# Patient Record
Sex: Male | Born: 1965 | Race: White | Hispanic: No | Smoking: Never smoker
Health system: Southern US, Community
[De-identification: ages and names within clinical notes are randomized; demographics above are authoritative.]

## PROBLEM LIST (undated history)

## (undated) DIAGNOSIS — E119 Type 2 diabetes mellitus without complications: Secondary | ICD-10-CM

## (undated) DIAGNOSIS — E291 Testicular hypofunction: Secondary | ICD-10-CM

## (undated) DIAGNOSIS — N183 Chronic kidney disease, stage 3 unspecified: Secondary | ICD-10-CM

## (undated) DIAGNOSIS — E785 Hyperlipidemia, unspecified: Secondary | ICD-10-CM

## (undated) DIAGNOSIS — I10 Essential (primary) hypertension: Secondary | ICD-10-CM

## (undated) DIAGNOSIS — M109 Gout, unspecified: Secondary | ICD-10-CM

## (undated) DIAGNOSIS — E669 Obesity, unspecified: Secondary | ICD-10-CM

## (undated) DIAGNOSIS — F419 Anxiety disorder, unspecified: Secondary | ICD-10-CM

## (undated) DIAGNOSIS — M1990 Unspecified osteoarthritis, unspecified site: Secondary | ICD-10-CM

## (undated) HISTORY — DX: Chronic kidney disease, stage 3 (moderate): N18.3

## (undated) HISTORY — DX: Hyperlipidemia, unspecified: E78.5

## (undated) HISTORY — PX: COLONOSCOPY: SHX174

## (undated) HISTORY — DX: Unspecified osteoarthritis, unspecified site: M19.90

## (undated) HISTORY — DX: Chronic kidney disease, stage 3 unspecified: N18.30

## (undated) HISTORY — PX: POLYPECTOMY: SHX149

## (undated) HISTORY — DX: Morbid (severe) obesity due to excess calories: E66.01

## (undated) HISTORY — DX: Testicular hypofunction: E29.1

## (undated) HISTORY — DX: Essential (primary) hypertension: I10

## (undated) HISTORY — DX: Type 2 diabetes mellitus without complications: E11.9

## (undated) HISTORY — DX: Obesity, unspecified: E66.9

## (undated) HISTORY — DX: Gout, unspecified: M10.9

## (undated) HISTORY — DX: Anxiety disorder, unspecified: F41.9

---

## 1999-07-24 ENCOUNTER — Encounter: Payer: Self-pay | Admitting: Family Medicine

## 1999-07-24 ENCOUNTER — Encounter: Admission: RE | Admit: 1999-07-24 | Discharge: 1999-07-24 | Payer: Self-pay | Admitting: Family Medicine

## 1999-08-19 ENCOUNTER — Encounter: Admission: RE | Admit: 1999-08-19 | Discharge: 1999-11-17 | Payer: Self-pay | Admitting: Family Medicine

## 2001-11-15 ENCOUNTER — Ambulatory Visit (HOSPITAL_COMMUNITY): Admission: RE | Admit: 2001-11-15 | Discharge: 2001-11-15 | Payer: Self-pay | Admitting: Family Medicine

## 2001-11-15 ENCOUNTER — Encounter: Payer: Self-pay | Admitting: Family Medicine

## 2001-12-29 ENCOUNTER — Ambulatory Visit (HOSPITAL_COMMUNITY): Admission: RE | Admit: 2001-12-29 | Discharge: 2001-12-29 | Payer: Self-pay | Admitting: Family Medicine

## 2001-12-29 ENCOUNTER — Encounter: Payer: Self-pay | Admitting: Family Medicine

## 2002-02-07 ENCOUNTER — Ambulatory Visit (HOSPITAL_BASED_OUTPATIENT_CLINIC_OR_DEPARTMENT_OTHER): Admission: RE | Admit: 2002-02-07 | Discharge: 2002-02-07 | Payer: Self-pay | Admitting: Orthopedic Surgery

## 2002-07-06 ENCOUNTER — Ambulatory Visit (HOSPITAL_COMMUNITY): Admission: RE | Admit: 2002-07-06 | Discharge: 2002-07-06 | Payer: Self-pay | Admitting: Family Medicine

## 2002-07-06 ENCOUNTER — Encounter: Payer: Self-pay | Admitting: Family Medicine

## 2002-08-18 ENCOUNTER — Ambulatory Visit (HOSPITAL_COMMUNITY): Admission: RE | Admit: 2002-08-18 | Discharge: 2002-08-18 | Payer: Self-pay | Admitting: *Deleted

## 2002-08-18 ENCOUNTER — Encounter: Payer: Self-pay | Admitting: *Deleted

## 2004-12-13 ENCOUNTER — Ambulatory Visit (HOSPITAL_COMMUNITY): Admission: RE | Admit: 2004-12-13 | Discharge: 2004-12-13 | Payer: Self-pay | Admitting: Urology

## 2005-01-06 HISTORY — PX: KNEE SURGERY: SHX244

## 2005-12-22 ENCOUNTER — Ambulatory Visit (HOSPITAL_COMMUNITY): Admission: RE | Admit: 2005-12-22 | Discharge: 2005-12-22 | Payer: Self-pay | Admitting: Family Medicine

## 2005-12-23 ENCOUNTER — Ambulatory Visit (HOSPITAL_COMMUNITY): Admission: RE | Admit: 2005-12-23 | Discharge: 2005-12-23 | Payer: Self-pay | Admitting: Internal Medicine

## 2013-09-02 ENCOUNTER — Encounter: Payer: Self-pay | Admitting: Gastroenterology

## 2013-09-14 ENCOUNTER — Ambulatory Visit (INDEPENDENT_AMBULATORY_CARE_PROVIDER_SITE_OTHER): Payer: 59 | Admitting: Gastroenterology

## 2013-09-14 ENCOUNTER — Encounter: Payer: Self-pay | Admitting: Gastroenterology

## 2013-09-14 VITALS — BP 120/74 | HR 80 | Ht 66.0 in | Wt 309.0 lb

## 2013-09-14 DIAGNOSIS — R195 Other fecal abnormalities: Secondary | ICD-10-CM

## 2013-09-14 MED ORDER — PEG-KCL-NACL-NASULF-NA ASC-C 100 G PO SOLR: 1.0000 | Freq: Once | ORAL | Status: DC

## 2013-09-14 NOTE — Progress Notes (Signed)
History of Present Illness: This is a 48 year old male referred by Dr. Brigitte Pulse Hemoccult-positive stool found on a recent physical exam. He has no gastrointestinal complaints. No prior gastrointestinal evaluations. Denies weight loss, abdominal pain, constipation, diarrhea, change in stool caliber, melena, hematochezia, nausea, vomiting, dysphagia, reflux symptoms, chest pain.  No Known Allergies Outpatient Prescriptions Prior to Visit  Medication Sig Dispense Refill  . allopurinol (ZYLOPRIM) 300 MG tablet Take 300 mg by mouth daily.      Marland Kitchen amLODipine (NORVASC) 10 MG tablet Take 10 mg by mouth daily.      Marland Kitchen aspirin 81 MG tablet Take 81 mg by mouth daily.      Marland Kitchen atorvastatin (LIPITOR) 40 MG tablet Take 40 mg by mouth daily.      . Canagliflozin (INVOKANA) 300 MG TABS Take 1 tablet by mouth daily.      . colchicine 0.6 MG tablet Take 0.6 mg by mouth every 8 (eight) hours as needed.      Marland Kitchen glipiZIDE (GLUCOTROL) 5 MG tablet Take by mouth 2 (two) times daily before a meal.      . hydrochlorothiazide (MICROZIDE) 12.5 MG capsule Take 12.5 mg by mouth daily.      . meloxicam (MOBIC) 15 MG tablet Take 15 mg by mouth daily.      . metFORMIN (GLUCOPHAGE) 1000 MG tablet Take 1,000 mg by mouth 2 (two) times daily with a meal.      . valsartan (DIOVAN) 320 MG tablet Take 320 mg by mouth daily.       No facility-administered medications prior to visit.   Past Medical History  Diagnosis Date  . Gout   . Hypertension   . Hyperlipidemia   . Obesity   . Osteoarthritis   . Anxiety   . Diabetes mellitus, type 2   . Morbid obesity   . Chronic kidney disease, stage 3   . Hypogonadism in male    Past Surgical History  Procedure Laterality Date  . Knee surgery Right 2007   History   Social History  . Marital Status: Divorced    Spouse Name: N/A    Number of Children: N/A  . Years of Education: N/A   Occupational History  .  [place] History Main Topics  . Smoking status:  Never Smoker   . Smokeless tobacco: Never Used  . Alcohol Use: No  . Drug Use: No  . Sexual Activity: None   Other Topics Concern  . None   Social History Narrative  . None   Family History  Problem Relation Age of Onset  . Hypertension Mother   . Breast cancer Mother   . CVA Father   . Diabetes type II Father   . Hypertension Father      Review of Systems: Pertinent positive and negative review of systems were noted in the above HPI section. All other review of systems were otherwise negative.    Physical Exam: General: Well developed , well nourished, no acute distress Head: Normocephalic and atraumatic Eyes:  sclerae anicteric, EOMI Ears: Normal auditory acuity Mouth: No deformity or lesions Neck: Supple, no masses or thyromegaly Lungs: Clear throughout to auscultation Heart: Regular rate and rhythm; no murmurs, rubs or bruits Abdomen: Soft, non tender and non distended. No masses, hepatosplenomegaly or hernias noted. Normal Bowel sounds Rectal: Deferred to colonoscopy Musculoskeletal: Symmetrical with no gross deformities  Skin: No lesions on visible extremities Pulses:  Normal pulses noted Extremities: No clubbing,  cyanosis, edema or deformities noted Neurological: Alert oriented x 4, grossly nonfocal Cervical Nodes:  No significant cervical adenopathy Inguinal Nodes: No significant inguinal adenopathy Psychological:  Alert and cooperative. Normal mood and affect  Assessment and Recommendations:  1. Asymptomatic Hemoccult positive stool. Rule out colorectal neoplasms, hemorrhoids and other disorders. Schedule colonoscopy. The risks, benefits, and alternatives to colonoscopy with possible biopsy and possible polypectomy were discussed with the patient and they consent to proceed.

## 2013-09-14 NOTE — Patient Instructions (Signed)
You have been scheduled for a colonoscopy. Please follow written instructions given to you at your visit today.  Please pick up your prep kit at the pharmacy within the next 1-3 days. If you use inhalers (even only as needed), please bring them with you on the day of your procedure. Your physician has requested that you go to www.startemmi.com and enter the access code given to you at your visit today. This web site gives a general overview about your procedure. However, you should still follow specific instructions given to you by our office regarding your preparation for the procedure.  Thank you for choosing me and [facility].  Pricilla Riffle. Dagoberto Ligas., MD., Marval Regal  cc: Marton Redwood, MD

## 2013-09-23 ENCOUNTER — Encounter: Payer: Self-pay | Admitting: Gastroenterology

## 2013-10-20 ENCOUNTER — Encounter: Payer: Self-pay | Admitting: Gastroenterology

## 2013-10-20 ENCOUNTER — Ambulatory Visit (AMBULATORY_SURGERY_CENTER): Payer: 59 | Admitting: Gastroenterology

## 2013-10-20 VITALS — BP 117/65 | HR 76 | Temp 98.3°F | Resp 13 | Ht 66.0 in | Wt 309.0 lb

## 2013-10-20 DIAGNOSIS — R195 Other fecal abnormalities: Secondary | ICD-10-CM

## 2013-10-20 DIAGNOSIS — D124 Benign neoplasm of descending colon: Secondary | ICD-10-CM

## 2013-10-20 DIAGNOSIS — D120 Benign neoplasm of cecum: Secondary | ICD-10-CM

## 2013-10-20 DIAGNOSIS — D125 Benign neoplasm of sigmoid colon: Secondary | ICD-10-CM

## 2013-10-20 DIAGNOSIS — D122 Benign neoplasm of ascending colon: Secondary | ICD-10-CM

## 2013-10-20 LAB — GLUCOSE, CAPILLARY
GLUCOSE-CAPILLARY: 230 mg/dL — AB (ref 70–99)
Glucose-Capillary: 238 mg/dL — ABNORMAL HIGH (ref 70–99)

## 2013-10-20 MED ORDER — SODIUM CHLORIDE 0.9 % IV SOLN: 500.0000 mL | INTRAVENOUS | Status: DC

## 2013-10-20 NOTE — Op Note (Signed)
[facility]  [address] [place], [id number]   COLONOSCOPY PROCEDURE REPORT  PATIENT: Jaime Reynolds, Jaime Reynolds  MR#: [id number] BIRTHDATE: 1965/12/16 , 21  yrs. old GENDER: male ENDOSCOPIST: Ladene Artist, MD, [facility] REFERRED BY:W.  Lutricia Feil, M.D. PROCEDURE DATE:  10/20/2013 PROCEDURE:   Colonoscopy with snare polypectomy First Screening Colonoscopy - Avg.  risk and is 50 yrs.  old or older - No.  Prior Negative Screening - Now for repeat screening. N/A  History of Adenoma - Now for follow-up colonoscopy & has been > or = to 3 yrs.  N/A  Polyps Removed Today? Yes. ASA CLASS:   Class III INDICATIONS:heme-positive stool. MEDICATIONS: Monitored anesthesia care and Propofol 300 mg IV DESCRIPTION OF PROCEDURE:   After the risks benefits and alternatives of the procedure were thoroughly explained, informed consent was obtained.  The digital rectal exam revealed no abnormalities of the rectum.   The LB [id number]  endoscope was introduced through the anus and advanced to the cecum, which was identified by both the appendix and ileocecal valve. No adverse events experienced.   The quality of the prep was good, using MoviPrep  The instrument was then slowly withdrawn as the colon was fully examined.  COLON FINDINGS: Two sessile polyps measuring 6-8 mm in size were found at the cecum.  A polypectomy was performed with a cold snare. The resection was complete, the polyp tissue was completely retrieved and sent to histology.   Three sessile polyps measuring 5 mm in size were found in the sigmoid colon, descending colon, and ascending colon.  A polypectomy was performed with a cold snare. The resection was complete, the polyp tissue was completely retrieved and sent to histology.   The examination was otherwise normal.  Retroflexed views revealed no abnormalities. The time to cecum=1 minutes 36 seconds.  Withdrawal time=14 minutes 09 seconds. The scope was withdrawn and  the procedure completed.  COMPLICATIONS: There were no immediate complications.  ENDOSCOPIC IMPRESSION: 1.   Two sessile polyps at the cecum; polypectomy performed with a cold snare 2.  Three sessile polyps in the sigmoid, descendin, and ascending colon; polypectomy performed with a cold snare  RECOMMENDATIONS: 1.  Await pathology results 2.  Hold Aspirin and all other NSAIDS for 2 weeks. 3.  Repeat colonoscopy in 3 years if 3 or more polyps adenomatous: 5 years if 1-2 polyps adenomatous; otherwise 10 years  eSigned:  Ladene Artist, MD, [facility] 10/20/2013 2:03 PM

## 2013-10-20 NOTE — Progress Notes (Signed)
Patient awakening,vss,report to rn 

## 2013-10-20 NOTE — Progress Notes (Signed)
Called to room to assist during endoscopic procedure.  Patient ID and intended procedure confirmed with present staff. Received instructions for my participation in the procedure from the performing physician.  

## 2013-10-20 NOTE — Patient Instructions (Signed)

## 2013-10-21 ENCOUNTER — Telehealth: Payer: Self-pay | Admitting: *Deleted

## 2013-10-21 NOTE — Telephone Encounter (Signed)
  Follow up Call-  Call back number 10/20/2013  Post procedure Call Back phone  # [phone]  Permission to leave phone message Yes     Patient questions:  Do you have a fever, pain , or abdominal swelling? No. Pain Score  0 *  Have you tolerated food without any problems? Yes.    Have you been able to return to your normal activities? Yes.    Do you have any questions about your discharge instructions: Diet   No. Medications  No. Follow up visit  No.  Do you have questions or concerns about your Care? No.  Actions: * If pain score is 4 or above: No action needed, pain <4.

## 2013-10-26 ENCOUNTER — Encounter: Payer: Self-pay | Admitting: Gastroenterology

## 2016-01-10 DIAGNOSIS — N183 Chronic kidney disease, stage 3 (moderate): Secondary | ICD-10-CM | POA: Diagnosis not present

## 2016-01-10 DIAGNOSIS — I10 Essential (primary) hypertension: Secondary | ICD-10-CM | POA: Diagnosis not present

## 2016-01-10 DIAGNOSIS — E1129 Type 2 diabetes mellitus with other diabetic kidney complication: Secondary | ICD-10-CM | POA: Diagnosis not present

## 2016-01-25 DIAGNOSIS — E1129 Type 2 diabetes mellitus with other diabetic kidney complication: Secondary | ICD-10-CM | POA: Diagnosis not present

## 2016-01-25 DIAGNOSIS — I10 Essential (primary) hypertension: Secondary | ICD-10-CM | POA: Diagnosis not present

## 2016-01-25 DIAGNOSIS — E782 Mixed hyperlipidemia: Secondary | ICD-10-CM | POA: Diagnosis not present

## 2016-02-15 DIAGNOSIS — E119 Type 2 diabetes mellitus without complications: Secondary | ICD-10-CM | POA: Diagnosis not present

## 2016-05-30 DIAGNOSIS — N183 Chronic kidney disease, stage 3 (moderate): Secondary | ICD-10-CM | POA: Diagnosis not present

## 2016-05-30 DIAGNOSIS — I10 Essential (primary) hypertension: Secondary | ICD-10-CM | POA: Diagnosis not present

## 2016-05-30 DIAGNOSIS — E1129 Type 2 diabetes mellitus with other diabetic kidney complication: Secondary | ICD-10-CM | POA: Diagnosis not present

## 2016-06-11 ENCOUNTER — Ambulatory Visit (INDEPENDENT_AMBULATORY_CARE_PROVIDER_SITE_OTHER): Payer: 59 | Admitting: Physician Assistant

## 2016-06-11 ENCOUNTER — Encounter (INDEPENDENT_AMBULATORY_CARE_PROVIDER_SITE_OTHER): Payer: Self-pay | Admitting: Physician Assistant

## 2016-06-11 ENCOUNTER — Ambulatory Visit (INDEPENDENT_AMBULATORY_CARE_PROVIDER_SITE_OTHER): Payer: 59

## 2016-06-11 DIAGNOSIS — M25561 Pain in right knee: Secondary | ICD-10-CM

## 2016-06-11 DIAGNOSIS — G8929 Other chronic pain: Secondary | ICD-10-CM | POA: Diagnosis not present

## 2016-06-11 DIAGNOSIS — M170 Bilateral primary osteoarthritis of knee: Secondary | ICD-10-CM

## 2016-06-11 MED ORDER — DICLOFENAC SODIUM 75 MG PO TBEC: 75.0000 mg | DELAYED_RELEASE_TABLET | Freq: Two times a day (BID) | ORAL | 1 refills | Status: DC

## 2016-06-11 NOTE — Progress Notes (Signed)
Office Visit Note   Patient: Jaime Reynolds           Date of Birth: June 29, 1965           MRN: [id number] Visit Date: 06/11/2016              Requested by: Marton Redwood, MD [address] [place], [place] [address] PCP: Marton Redwood, MD   Assessment & Plan: Visit Diagnoses:  1. Chronic pain of right knee   2. Bilateral primary osteoarthritis of knee     Plan: Discussed with Jaime Reynolds treatment options he would like to continue conservative treatment of his right knee pain and osteoarthritis. Therefore recommend weight loss and I did commend him on the amount of weight that he's lost recently. Placed him on diclofenac he's to stop all other NSAIDs. Showed him open patella brace he will try to obtain one of these on his own over-the-counter. See him back in a month check his progress lack of. May consider supplemental injection for right knee in the future.  Follow-Up Instructions: Return in about 4 weeks (around 07/09/2016).   Orders:  Orders Placed This Encounter  Procedures  . XR KNEE 3 VIEW RIGHT   Meds ordered this encounter  Medications  . diclofenac (VOLTAREN) 75 MG EC tablet    Sig: Take 1 tablet (75 mg total) by mouth 2 (two) times daily.    Dispense:  60 tablet    Refill:  1      Procedures: No procedures performed   Clinical Data: No additional findings.   Subjective: Chief Complaint  Patient presents with  . Right Knee - Pain, New Patient (Initial Visit)    HPI Jaime Reynolds is a 51 year old male who were seen today for right knee pain. He's had no particular injury to the knee. He has no mechanical symptoms of the right knee. Most of his pain is along the inner side of his right knee. Has pain with prolonged standing or walking. He states he's been taking a lot of ibuprofen which seems to help some with his knee pain. He is also diabetic and has a history of gout. Reports last hemoglobin A1c was around 6. Review of Systems No chest pain shortness breath  fevers chills. Otherwise please see history of present illness  Objective: Vital Signs: There were no vitals taken for this visit.  Physical Exam  Constitutional: He is oriented to person, place, and time. He appears well-developed and well-nourished. No distress.  Pulmonary/Chest: Effort normal.  Neurological: He is alert and oriented to person, place, and time.  Psychiatric: He has a normal mood and affect. His behavior is normal.    Ortho Exam Significant crepitus both knees with passive range of motion. No instability valgus varus stressing of either knee. No effusion abnormal warmth erythema or ecchymosis of either knee. Negative McMurray's bilaterally. Right knee lacks full extension by last 5 his flexion beyond 110. Left knee full range of motion without pain. Specialty Comments:  No specialty comments available.  Imaging: Xr Knee 3 View Right  Result Date: 06/11/2016 AP lateral views sunrise view both knees: No acute fracture. Has tricompartmental changes with right knee lateral compartment moderate severe and moderate medial joint line narrowing. Left knee mild medial joint line narrowing moderate lateral joint line narrowing. Severe patellofemoral arthritic changes bilateral knees left slightly worse than right. No subluxation dislocation knees    PMFS History: There are no active problems to display for this patient.  Past Medical  History:  Diagnosis Date  . Anxiety   . Chronic kidney disease, stage 3   . Diabetes mellitus, type 2 ([place])   . Gout   . Hyperlipidemia   . Hypertension   . Hypogonadism in male   . Morbid obesity ([place])   . Obesity   . Osteoarthritis     Family History  Problem Relation Age of Onset  . Hypertension Mother   . Breast cancer Mother   . CVA Father   . Diabetes type II Father   . Hypertension Father     Past Surgical History:  Procedure Laterality Date  . KNEE SURGERY Right 2007   Social History   Occupational History  .  [place] History Main Topics  . Smoking status: Never Smoker  . Smokeless tobacco: Never Used  . Alcohol use No  . Drug use: No  . Sexual activity: Not on file

## 2016-07-17 ENCOUNTER — Ambulatory Visit (INDEPENDENT_AMBULATORY_CARE_PROVIDER_SITE_OTHER): Payer: 59 | Admitting: Physician Assistant

## 2016-07-17 DIAGNOSIS — M1711 Unilateral primary osteoarthritis, right knee: Secondary | ICD-10-CM

## 2016-07-17 NOTE — Progress Notes (Signed)
Jaime Reynolds returns today follow-up of his right knee osteoarthritis. He states that the exercises the open patella brace in the diclofenac definitely helped with his pain in the knee. He states overall is doing well. He's having no pain in the knee. Still some stiffness. No mechanical symptoms of the knee   Physical exam: General well-developed well-nourished male in no acute distress. Mood and Affect appropriate. Psychiatric alert and 3 Right knee no effusion abnormal warmth or erythema. He has crepitus with passive range of motion of knee. No instability valgus varus stressing.  Impression: Osteoarthritis arthritis right knee  Plan: Jaime Reynolds is overall doing well very conservative measures a flight to continue these conservative measures now. It did discuss with him the need to continue to work on quad strengthening and weight loss. May consider cortisone injection in the. Recommended trying Tumeric.

## 2016-08-06 ENCOUNTER — Other Ambulatory Visit (INDEPENDENT_AMBULATORY_CARE_PROVIDER_SITE_OTHER): Payer: Self-pay | Admitting: Physician Assistant

## 2016-09-05 ENCOUNTER — Other Ambulatory Visit (INDEPENDENT_AMBULATORY_CARE_PROVIDER_SITE_OTHER): Payer: Self-pay | Admitting: Orthopaedic Surgery

## 2016-09-30 DIAGNOSIS — E1129 Type 2 diabetes mellitus with other diabetic kidney complication: Secondary | ICD-10-CM | POA: Diagnosis not present

## 2016-09-30 DIAGNOSIS — E782 Mixed hyperlipidemia: Secondary | ICD-10-CM | POA: Diagnosis not present

## 2016-09-30 DIAGNOSIS — M109 Gout, unspecified: Secondary | ICD-10-CM | POA: Diagnosis not present

## 2016-09-30 DIAGNOSIS — Z125 Encounter for screening for malignant neoplasm of prostate: Secondary | ICD-10-CM | POA: Diagnosis not present

## 2016-10-04 ENCOUNTER — Other Ambulatory Visit (INDEPENDENT_AMBULATORY_CARE_PROVIDER_SITE_OTHER): Payer: Self-pay | Admitting: Orthopaedic Surgery

## 2016-10-07 DIAGNOSIS — E1129 Type 2 diabetes mellitus with other diabetic kidney complication: Secondary | ICD-10-CM | POA: Diagnosis not present

## 2016-10-07 DIAGNOSIS — I10 Essential (primary) hypertension: Secondary | ICD-10-CM | POA: Diagnosis not present

## 2016-10-07 DIAGNOSIS — Z0000 Encounter for general adult medical examination without abnormal findings: Secondary | ICD-10-CM | POA: Diagnosis not present

## 2016-10-07 DIAGNOSIS — E782 Mixed hyperlipidemia: Secondary | ICD-10-CM | POA: Diagnosis not present

## 2016-10-09 ENCOUNTER — Encounter: Payer: Self-pay | Admitting: Gastroenterology

## 2016-10-17 DIAGNOSIS — Z1212 Encounter for screening for malignant neoplasm of rectum: Secondary | ICD-10-CM | POA: Diagnosis not present

## 2016-11-05 ENCOUNTER — Other Ambulatory Visit (INDEPENDENT_AMBULATORY_CARE_PROVIDER_SITE_OTHER): Payer: Self-pay | Admitting: Orthopaedic Surgery

## 2016-12-06 ENCOUNTER — Other Ambulatory Visit (INDEPENDENT_AMBULATORY_CARE_PROVIDER_SITE_OTHER): Payer: Self-pay | Admitting: Orthopaedic Surgery

## 2016-12-12 ENCOUNTER — Ambulatory Visit (AMBULATORY_SURGERY_CENTER): Payer: Self-pay | Admitting: *Deleted

## 2016-12-12 ENCOUNTER — Other Ambulatory Visit: Payer: Self-pay

## 2016-12-12 VITALS — Ht 67.0 in | Wt 294.0 lb

## 2016-12-12 DIAGNOSIS — Z86010 Personal history of colonic polyps: Secondary | ICD-10-CM

## 2016-12-12 MED ORDER — NA SULFATE-K SULFATE-MG SULF 17.5-3.13-1.6 GM/177ML PO SOLN: 1.0000 [IU] | Freq: Once | ORAL | 0 refills | Status: AC

## 2016-12-12 NOTE — Progress Notes (Signed)
No egg or soy allergy known to patient  No issues with past sedation with any surgeries  or procedures, no intubation problems  No diet pills per patient No home 02 use per patient  No blood thinners per patient  Pt denies issues with constipation  No A fib or A flutter  EMMI video sent to pt's e mail pt declined   

## 2016-12-15 ENCOUNTER — Encounter: Payer: Self-pay | Admitting: Gastroenterology

## 2016-12-26 ENCOUNTER — Encounter: Payer: Self-pay | Admitting: Gastroenterology

## 2016-12-26 ENCOUNTER — Other Ambulatory Visit: Payer: Self-pay

## 2016-12-26 ENCOUNTER — Ambulatory Visit (AMBULATORY_SURGERY_CENTER): Payer: 59 | Admitting: Gastroenterology

## 2016-12-26 VITALS — BP 128/68 | HR 76 | Temp 99.1°F | Resp 21 | Ht 67.0 in | Wt 294.0 lb

## 2016-12-26 DIAGNOSIS — Z86010 Personal history of colonic polyps: Secondary | ICD-10-CM

## 2016-12-26 DIAGNOSIS — E119 Type 2 diabetes mellitus without complications: Secondary | ICD-10-CM | POA: Diagnosis not present

## 2016-12-26 DIAGNOSIS — I10 Essential (primary) hypertension: Secondary | ICD-10-CM | POA: Diagnosis not present

## 2016-12-26 MED ORDER — SODIUM CHLORIDE 0.9 % IV SOLN: 500.0000 mL | Freq: Once | INTRAVENOUS | Status: AC

## 2016-12-26 NOTE — Progress Notes (Signed)
To recovery, report to RN, VSS. 

## 2016-12-26 NOTE — Patient Instructions (Signed)
YOU HAD AN ENDOSCOPIC PROCEDURE TODAY AT THE [facility]:   Refer to the procedure report that was given to you for any specific questions about what was found during the examination.  If the procedure report does not answer your questions, please call your gastroenterologist to clarify.  If you requested that your care partner not be given the details of your procedure findings, then the procedure report has been included in a sealed envelope for you to review at your convenience later.  YOU SHOULD EXPECT: Some feelings of bloating in the abdomen. Passage of more gas than usual.  Walking can help get rid of the air that was put into your GI tract during the procedure and reduce the bloating. If you had a lower endoscopy (such as a colonoscopy or flexible sigmoidoscopy) you may notice spotting of blood in your stool or on the toilet paper. If you underwent a bowel prep for your procedure, you may not have a normal bowel movement for a few days.  Please Note:  You might notice some irritation and congestion in your nose or some drainage.  This is from the oxygen used during your procedure.  There is no need for concern and it should clear up in a day or so.  SYMPTOMS TO REPORT IMMEDIATELY:   Following lower endoscopy (colonoscopy or flexible sigmoidoscopy):  Excessive amounts of blood in the stool  Significant tenderness or worsening of abdominal pains  Swelling of the abdomen that is new, acute  Fever of 100F or higher   For urgent or emergent issues, a gastroenterologist can be reached at any hour by calling [phone].   DIET:  We do recommend a small meal at first, but then you may proceed to your regular diet.  Drink plenty of fluids but you should avoid alcoholic beverages for 24 hours.  ACTIVITY:  You should plan to take it easy for the rest of today and you should NOT DRIVE or use heavy machinery until tomorrow (because of the sedation medicines used during the test).     FOLLOW UP: Our staff will call the number listed on your records the next business day following your procedure to check on you and address any questions or concerns that you may have regarding the information given to you following your procedure. If we do not reach you, we will leave a message.  However, if you are feeling well and you are not experiencing any problems, there is no need to return our call.  We will assume that you have returned to your regular daily activities without incident.  If any biopsies were taken you will be contacted by phone or by letter within the next 1-3 weeks.  Please call us at [phone] if you have not heard about the biopsies in 3 weeks.    SIGNATURES/CONFIDENTIALITY: You and/or your care partner have signed paperwork which will be entered into your electronic medical record.  These signatures attest to the fact that that the information above on your After Visit Summary has been reviewed and is understood.  Full responsibility of the confidentiality of this discharge information lies with you and/or your care-partner.  Read all of the handouts given to you by your recovery room nurse. 

## 2016-12-26 NOTE — Progress Notes (Signed)
Pt's states no medical or surgical changes since previsit or office visit. 

## 2016-12-26 NOTE — Op Note (Signed)
[facility] Patient Name: Jaime Reynolds Procedure Date: 12/26/2016 9:12 AM MRN: [id number] Endoscopist: Ladene Artist , MD Age: 51 Referring MD:  Date of Birth: 11/25/1965 Gender: Male Account #: [id number] Procedure:                Colonoscopy Indications:              Surveillance: Personal history of adenomatous                            polyps on last colonoscopy 3 years ago Medicines:                Monitored Anesthesia Care Procedure:                Pre-Anesthesia Assessment:                           - Prior to the procedure, a History and Physical                            was performed, and patient medications and                            allergies were reviewed. The patient's tolerance of                            previous anesthesia was also reviewed. The risks                            and benefits of the procedure and the sedation                            options and risks were discussed with the patient.                            All questions were answered, and informed consent                            was obtained. Prior Anticoagulants: The patient has                            taken no previous anticoagulant or antiplatelet                            agents. ASA Grade Assessment: II - A patient with                            mild systemic disease. After reviewing the risks                            and benefits, the patient was deemed in                            satisfactory condition to undergo the procedure.  After obtaining informed consent, the colonoscope                            was passed under direct vision. Throughout the                            procedure, the patient's blood pressure, pulse, and                            oxygen saturations were monitored continuously. The                            Model CF-HQ190L [id number]) scope was introduced                            through the anus and  advanced to the the cecum,                            identified by appendiceal orifice and ileocecal                            valve. The ileocecal valve, appendiceal orifice,                            and rectum were photographed. The quality of the                            bowel preparation was good. The colonoscopy was                            performed without difficulty. The patient tolerated                            the procedure well. Scope In: 9:15:15 AM Scope Out: 9:26:02 AM Scope Withdrawal Time: 0 hours 9 minutes 20 seconds  Total Procedure Duration: 0 hours 10 minutes 47 seconds  Findings:                 The perianal and digital rectal examinations were                            normal.                           Internal hemorrhoids were found during                            retroflexion. The hemorrhoids were small and Grade                            I (internal hemorrhoids that do not prolapse).                           The exam was otherwise without abnormality on  direct and retroflexion views. Complications:            No immediate complications. Estimated blood loss:                            None. Estimated Blood Loss:     Estimated blood loss: none. Impression:               - Internal hemorrhoids.                           - The examination was otherwise normal on direct                            and retroflexion views.                           - No specimens collected. Recommendation:           - Repeat colonoscopy in 5 years for surveillance.                           - Patient has a contact number available for                            emergencies. The signs and symptoms of potential                            delayed complications were discussed with the                            patient. Return to normal activities tomorrow.                            Written discharge instructions were provided to the                             patient.                           - Resume previous diet.                           - Continue present medications. Ladene Artist, MD 12/26/2016 9:29:27 AM This report has been signed electronically.

## 2016-12-29 ENCOUNTER — Telehealth: Payer: Self-pay | Admitting: *Deleted

## 2016-12-29 NOTE — Telephone Encounter (Signed)
  Follow up Call-  Call back number 12/26/2016  Post procedure Call Back phone  # [phone]  Permission to leave phone message Yes  Some recent data might be hidden     Patient questions:  Do you have a fever, pain , or abdominal swelling? No. Pain Score  0 *  Have you tolerated food without any problems? Yes.    Have you been able to return to your normal activities? Yes.    Do you have any questions about your discharge instructions: Diet   No. Medications  No. Follow up visit  No.  Do you have questions or concerns about your Care? No.  Actions: * If pain score is 4 or above: No action needed, pain <4.

## 2017-01-09 ENCOUNTER — Other Ambulatory Visit (INDEPENDENT_AMBULATORY_CARE_PROVIDER_SITE_OTHER): Payer: Self-pay | Admitting: Orthopaedic Surgery

## 2017-02-06 ENCOUNTER — Other Ambulatory Visit (INDEPENDENT_AMBULATORY_CARE_PROVIDER_SITE_OTHER): Payer: Self-pay | Admitting: Orthopaedic Surgery

## 2017-02-26 DIAGNOSIS — E119 Type 2 diabetes mellitus without complications: Secondary | ICD-10-CM | POA: Diagnosis not present

## 2017-03-06 ENCOUNTER — Other Ambulatory Visit (INDEPENDENT_AMBULATORY_CARE_PROVIDER_SITE_OTHER): Payer: Self-pay | Admitting: Orthopaedic Surgery

## 2017-04-01 DIAGNOSIS — R208 Other disturbances of skin sensation: Secondary | ICD-10-CM | POA: Diagnosis not present

## 2017-04-03 ENCOUNTER — Other Ambulatory Visit (INDEPENDENT_AMBULATORY_CARE_PROVIDER_SITE_OTHER): Payer: Self-pay | Admitting: Orthopaedic Surgery

## 2017-04-10 DIAGNOSIS — E782 Mixed hyperlipidemia: Secondary | ICD-10-CM | POA: Diagnosis not present

## 2017-04-10 DIAGNOSIS — I10 Essential (primary) hypertension: Secondary | ICD-10-CM | POA: Diagnosis not present

## 2017-04-10 DIAGNOSIS — E1129 Type 2 diabetes mellitus with other diabetic kidney complication: Secondary | ICD-10-CM | POA: Diagnosis not present

## 2017-05-02 ENCOUNTER — Other Ambulatory Visit (INDEPENDENT_AMBULATORY_CARE_PROVIDER_SITE_OTHER): Payer: Self-pay | Admitting: Orthopaedic Surgery

## 2017-06-06 ENCOUNTER — Other Ambulatory Visit (INDEPENDENT_AMBULATORY_CARE_PROVIDER_SITE_OTHER): Payer: Self-pay | Admitting: Orthopaedic Surgery

## 2017-07-17 ENCOUNTER — Other Ambulatory Visit (INDEPENDENT_AMBULATORY_CARE_PROVIDER_SITE_OTHER): Payer: Self-pay | Admitting: Orthopaedic Surgery

## 2017-08-16 ENCOUNTER — Other Ambulatory Visit (INDEPENDENT_AMBULATORY_CARE_PROVIDER_SITE_OTHER): Payer: Self-pay | Admitting: Orthopaedic Surgery

## 2017-09-13 ENCOUNTER — Other Ambulatory Visit (INDEPENDENT_AMBULATORY_CARE_PROVIDER_SITE_OTHER): Payer: Self-pay | Admitting: Physician Assistant

## 2017-09-14 NOTE — Telephone Encounter (Signed)
Ok for refill? 

## 2017-10-18 ENCOUNTER — Other Ambulatory Visit (INDEPENDENT_AMBULATORY_CARE_PROVIDER_SITE_OTHER): Payer: Self-pay | Admitting: Physician Assistant

## 2017-11-13 DIAGNOSIS — Z0000 Encounter for general adult medical examination without abnormal findings: Secondary | ICD-10-CM | POA: Diagnosis not present

## 2017-11-13 DIAGNOSIS — E782 Mixed hyperlipidemia: Secondary | ICD-10-CM | POA: Diagnosis not present

## 2017-11-13 DIAGNOSIS — E1129 Type 2 diabetes mellitus with other diabetic kidney complication: Secondary | ICD-10-CM | POA: Diagnosis not present

## 2017-11-13 DIAGNOSIS — R82998 Other abnormal findings in urine: Secondary | ICD-10-CM | POA: Diagnosis not present

## 2017-11-13 DIAGNOSIS — Z125 Encounter for screening for malignant neoplasm of prostate: Secondary | ICD-10-CM | POA: Diagnosis not present

## 2017-11-17 DIAGNOSIS — E782 Mixed hyperlipidemia: Secondary | ICD-10-CM | POA: Diagnosis not present

## 2017-11-17 DIAGNOSIS — E1129 Type 2 diabetes mellitus with other diabetic kidney complication: Secondary | ICD-10-CM | POA: Diagnosis not present

## 2017-11-17 DIAGNOSIS — I10 Essential (primary) hypertension: Secondary | ICD-10-CM | POA: Diagnosis not present

## 2017-11-17 DIAGNOSIS — Z1389 Encounter for screening for other disorder: Secondary | ICD-10-CM | POA: Diagnosis not present

## 2017-11-17 DIAGNOSIS — Z0000 Encounter for general adult medical examination without abnormal findings: Secondary | ICD-10-CM | POA: Diagnosis not present

## 2017-11-20 DIAGNOSIS — Z1212 Encounter for screening for malignant neoplasm of rectum: Secondary | ICD-10-CM | POA: Diagnosis not present

## 2017-11-22 ENCOUNTER — Other Ambulatory Visit (INDEPENDENT_AMBULATORY_CARE_PROVIDER_SITE_OTHER): Payer: Self-pay | Admitting: Orthopaedic Surgery

## 2017-12-20 ENCOUNTER — Other Ambulatory Visit (INDEPENDENT_AMBULATORY_CARE_PROVIDER_SITE_OTHER): Payer: Self-pay | Admitting: Orthopaedic Surgery

## 2017-12-21 NOTE — Telephone Encounter (Signed)
Ok to refill 

## 2017-12-21 NOTE — Telephone Encounter (Signed)
LMOM notifying patient.

## 2018-01-17 ENCOUNTER — Other Ambulatory Visit (INDEPENDENT_AMBULATORY_CARE_PROVIDER_SITE_OTHER): Payer: Self-pay | Admitting: Orthopaedic Surgery

## 2018-01-22 ENCOUNTER — Ambulatory Visit: Payer: 59 | Admitting: Podiatry

## 2018-02-12 ENCOUNTER — Other Ambulatory Visit (INDEPENDENT_AMBULATORY_CARE_PROVIDER_SITE_OTHER): Payer: Self-pay | Admitting: Orthopaedic Surgery

## 2018-02-12 NOTE — Telephone Encounter (Signed)
Ok for refill? 

## 2018-03-31 ENCOUNTER — Other Ambulatory Visit (INDEPENDENT_AMBULATORY_CARE_PROVIDER_SITE_OTHER): Payer: Self-pay | Admitting: Orthopaedic Surgery

## 2018-03-31 ENCOUNTER — Telehealth (INDEPENDENT_AMBULATORY_CARE_PROVIDER_SITE_OTHER): Payer: Self-pay | Admitting: Orthopaedic Surgery

## 2018-03-31 MED ORDER — DICLOFENAC SODIUM 75 MG PO TBEC: 75.0000 mg | DELAYED_RELEASE_TABLET | Freq: Two times a day (BID) | ORAL | 3 refills | Status: AC | PRN

## 2018-03-31 NOTE — Telephone Encounter (Signed)
See below

## 2018-03-31 NOTE — Telephone Encounter (Signed)
New Message   *STAT* If patient is at the pharmacy, call can be transferred to refill team.   1. Which medications need to be refilled? (please list name of each medication and dose if known)  diclofenac 75 mg tablet once daily  2. Which pharmacy/location (including street and city if local pharmacy) is medication to be sent to? [facility] [address] [place], [place] [address]  3. Do they need a 30 day or 90 day supply?  30 day supply

## 2018-05-21 DIAGNOSIS — I10 Essential (primary) hypertension: Secondary | ICD-10-CM | POA: Diagnosis not present

## 2018-05-21 DIAGNOSIS — E782 Mixed hyperlipidemia: Secondary | ICD-10-CM | POA: Diagnosis not present

## 2018-05-21 DIAGNOSIS — E1129 Type 2 diabetes mellitus with other diabetic kidney complication: Secondary | ICD-10-CM | POA: Diagnosis not present

## 2018-05-24 DIAGNOSIS — E1129 Type 2 diabetes mellitus with other diabetic kidney complication: Secondary | ICD-10-CM | POA: Diagnosis not present

## 2019-03-31 ENCOUNTER — Emergency Department (HOSPITAL_COMMUNITY)
Admission: EM | Admit: 2019-03-31 | Discharge: 2019-03-31 | Disposition: A | Discharge status: Home / Self Care | Payer: No Typology Code available for payment source | Attending: Emergency Medicine | Admitting: Emergency Medicine

## 2019-03-31 ENCOUNTER — Encounter (HOSPITAL_COMMUNITY): Payer: Self-pay | Admitting: Emergency Medicine

## 2019-03-31 ENCOUNTER — Emergency Department (HOSPITAL_COMMUNITY): Payer: No Typology Code available for payment source

## 2019-03-31 DIAGNOSIS — S0083XA Contusion of other part of head, initial encounter: Secondary | ICD-10-CM | POA: Insufficient documentation

## 2019-03-31 DIAGNOSIS — E1122 Type 2 diabetes mellitus with diabetic chronic kidney disease: Secondary | ICD-10-CM | POA: Diagnosis not present

## 2019-03-31 DIAGNOSIS — S0990XA Unspecified injury of head, initial encounter: Secondary | ICD-10-CM | POA: Diagnosis present

## 2019-03-31 DIAGNOSIS — Y999 Unspecified external cause status: Secondary | ICD-10-CM | POA: Insufficient documentation

## 2019-03-31 DIAGNOSIS — Z79899 Other long term (current) drug therapy: Secondary | ICD-10-CM | POA: Diagnosis not present

## 2019-03-31 DIAGNOSIS — S0081XA Abrasion of other part of head, initial encounter: Secondary | ICD-10-CM | POA: Diagnosis not present

## 2019-03-31 DIAGNOSIS — N183 Chronic kidney disease, stage 3 unspecified: Secondary | ICD-10-CM | POA: Diagnosis not present

## 2019-03-31 DIAGNOSIS — Y92410 Unspecified street and highway as the place of occurrence of the external cause: Secondary | ICD-10-CM | POA: Diagnosis not present

## 2019-03-31 DIAGNOSIS — Y939 Activity, unspecified: Secondary | ICD-10-CM | POA: Diagnosis not present

## 2019-03-31 DIAGNOSIS — Z794 Long term (current) use of insulin: Secondary | ICD-10-CM | POA: Diagnosis not present

## 2019-03-31 DIAGNOSIS — I129 Hypertensive chronic kidney disease with stage 1 through stage 4 chronic kidney disease, or unspecified chronic kidney disease: Secondary | ICD-10-CM | POA: Insufficient documentation

## 2019-03-31 DIAGNOSIS — T148XXA Other injury of unspecified body region, initial encounter: Secondary | ICD-10-CM

## 2019-03-31 DIAGNOSIS — Z23 Encounter for immunization: Secondary | ICD-10-CM | POA: Insufficient documentation

## 2019-03-31 MED ORDER — BACITRACIN ZINC 500 UNIT/GM EX OINT
TOPICAL_OINTMENT | Freq: Two times a day (BID) | CUTANEOUS | Status: DC
  Administered 2019-03-31: 1 via TOPICAL
  Filled 2019-03-31: qty 0.9

## 2019-03-31 MED ORDER — METHOCARBAMOL 500 MG PO TABS: 500.0000 mg | ORAL_TABLET | Freq: Two times a day (BID) | ORAL | 0 refills | Status: AC

## 2019-03-31 MED ORDER — TETANUS-DIPHTH-ACELL PERTUSSIS 5-2.5-18.5 LF-MCG/0.5 IM SUSP
0.5000 mL | Freq: Once | INTRAMUSCULAR | Status: AC
  Administered 2019-03-31: 0.5 mL via INTRAMUSCULAR
  Filled 2019-03-31: qty 0.5

## 2019-03-31 NOTE — Discharge Instructions (Signed)
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
## 2021-12-04 ENCOUNTER — Encounter: Payer: Self-pay | Admitting: Gastroenterology

## 2022-01-24 ENCOUNTER — Ambulatory Visit: Payer: 59 | Admitting: Diagnostic Neuroimaging

## 2022-03-13 ENCOUNTER — Encounter: Payer: Self-pay | Admitting: Diagnostic Neuroimaging

## 2022-03-13 ENCOUNTER — Ambulatory Visit: Payer: 59 | Admitting: Diagnostic Neuroimaging

## 2022-03-13 VITALS — BP 146/83 | HR 96 | Ht 66.0 in | Wt 273.4 lb

## 2022-03-13 DIAGNOSIS — H539 Unspecified visual disturbance: Secondary | ICD-10-CM

## 2022-03-13 NOTE — Progress Notes (Signed)
[facility]  PATIENT: Jaime Reynolds DOB: 1965/10/06  REFERRING CLINICIAN: [facility]* HISTORY FROM: patient  REASON FOR VISIT: new consult    HISTORICAL  CHIEF COMPLAINT:  Chief Complaint  Patient presents with   New Patient (Initial Visit)    Patient in room #6 and alone. Pt here today to discuss his vision. Pt states it seems his eyes goes white and he can't see anything for a few seconds.    HISTORY OF PRESENT ILLNESS:   57 year old male here for evaluation of transient visual disturbance.  History of hypertension, hyperlipidemia, diabetes, chronic kidney disease.  Patient had cataract surgeries in 2023, left side in February, right-sided March without any issues.  1 to 2 weeks after his second surgery he noticed a 5-second transient visual disturbance where his vision went totally white on both eyes.  No other associated symptoms.  He had a second event a week later.  Since that time no further events.  Patient denies any headaches, numbness, weakness, double vision or loss of vision.    REVIEW OF SYSTEMS: Full 14 system review of systems performed and negative with exception of: as per HPI.  ALLERGIES: No Known Allergies  HOME MEDICATIONS: Outpatient Medications Prior to Visit  Medication Sig Dispense Refill   allopurinol (ZYLOPRIM) 300 MG tablet Take 300 mg by mouth daily.     amLODipine (NORVASC) 10 MG tablet Take 10 mg by mouth daily.     atorvastatin (LIPITOR) 40 MG tablet Take 40 mg by mouth daily.     BD PEN NEEDLE NANO U/F 32G X 4 MM MISC   0   Canagliflozin (INVOKANA) 300 MG TABS Take 1 tablet by mouth daily.     colchicine 0.6 MG tablet Take 0.6 mg by mouth every 8 (eight) hours as needed.     hydrochlorothiazide (MICROZIDE) 12.5 MG capsule Take 12.5 mg by mouth daily.     irbesartan (AVAPRO) 300 MG tablet Take 300 mg by mouth daily.     JARDIANCE 25 MG TABS tablet Take 25 mg by mouth daily.     metFORMIN (GLUCOPHAGE) 1000  MG tablet Take 1,000 mg by mouth 2 (two) times daily with a meal.     methocarbamol (ROBAXIN) 500 MG tablet Take 1 tablet (500 mg total) by mouth 2 (two) times daily. 20 tablet 0   metoprolol succinate (TOPROL-XL) 50 MG 24 hr tablet Take 50 mg by mouth daily.     ONE TOUCH ULTRA TEST test strip SELF MONITOR BLOOD GLUCOSE TWICE A DAY  1   rosuvastatin (CRESTOR) 10 MG tablet Take 10 mg by mouth daily.     diclofenac (VOLTAREN) 75 MG EC tablet Take 1 tablet (75 mg total) by mouth 2 (two) times daily between meals as needed. (Patient not taking: Reported on 03/13/2022) 60 tablet 3   LEVEMIR FLEXTOUCH 100 UNIT/ML Pen INJECT 40 UNITS ONCE DAILY (Patient not taking: Reported on [id number])  0   TRULICITY 1.5 0000000 SOPN  (Patient not taking: Reported on 03/13/2022)  1   Facility-Administered Medications Prior to Visit  Medication Dose Route Frequency Provider Last Rate Last Admin   0.9 %  sodium chloride infusion  500 mL Intravenous Once Ladene Artist, MD        PAST MEDICAL HISTORY: Past Medical History:  Diagnosis Date   Anxiety    Chronic kidney disease, stage 3 ([place])    pt. denies so not sure   Diabetes mellitus, type 2 ([place])  Gout    Hyperlipidemia    Hypertension    Hypogonadism in male    Morbid obesity ([place])    Obesity    Osteoarthritis     PAST SURGICAL HISTORY: Past Surgical History:  Procedure Laterality Date   COLONOSCOPY     KNEE SURGERY Right 2007   POLYPECTOMY      FAMILY HISTORY: Family History  Problem Relation Age of Onset   Hypertension Mother    Breast cancer Mother    CVA Father    Diabetes type II Father    Hypertension Father    Colon cancer Neg Hx    Colon polyps Neg Hx    Esophageal cancer Neg Hx    Rectal cancer Neg Hx    Stomach cancer Neg Hx     SOCIAL HISTORY: Social History   Socioeconomic History   Marital status: Divorced    Spouse name: Not on file   Number of children: Not on file   Years of education: Not on file   Highest  education level: Not on file  Occupational History    Employer: [employer]  Tobacco Use   Smoking status: Never   Smokeless tobacco: Never  Substance and Sexual Activity   Alcohol use: No   Drug use: No   Sexual activity: Not on file  Other Topics Concern   Not on file  Social History Narrative   Not on file   Social Determinants of Health   Financial Resource Strain: Not on file  Food Insecurity: Not on file  Transportation Needs: Not on file  Physical Activity: Not on file  Stress: Not on file  Social Connections: Not on file  Intimate Partner Violence: Not on file     PHYSICAL EXAM  GENERAL EXAM/CONSTITUTIONAL: Vitals:  Vitals:   03/13/22 1545  BP: (!) 146/83  Pulse: 96  Weight: 273 lb 6.4 oz (124 kg)  Height: '5\' 6"'$  (1.676 m)   Body mass index is 44.13 kg/m. Wt Readings from Last 3 Encounters:  03/13/22 273 lb 6.4 oz (124 kg)  12/26/16 294 lb (133.4 kg)  12/12/16 294 lb (133.4 kg)   Patient is in no distress; well developed, nourished and groomed; neck is supple  CARDIOVASCULAR: Examination of carotid arteries is normal; no carotid bruits Regular rate and rhythm, no murmurs Examination of peripheral vascular system by observation and palpation is normal  EYES: Ophthalmoscopic exam of optic discs and posterior segments is normal; no papilledema or hemorrhages No results found.  MUSCULOSKELETAL: Gait, strength, tone, movements noted in Neurologic exam below  NEUROLOGIC: MENTAL STATUS:      No data to display         awake, alert, oriented to person, place and time recent and remote memory intact normal attention and concentration language fluent, comprehension intact, naming intact fund of knowledge appropriate  CRANIAL NERVE:  2nd - no papilledema on fundoscopic exam 2nd, 3rd, 4th, 6th - pupils equal and reactive to light, visual fields full to confrontation, extraocular muscles intact, no nystagmus 5th - facial sensation  symmetric 7th - facial strength symmetric 8th - hearing intact 9th - palate elevates symmetrically, uvula midline 11th - shoulder shrug symmetric 12th - tongue protrusion midline  MOTOR:  normal bulk and tone, full strength in the BUE, BLE  SENSORY:  normal and symmetric to light touch, temperature, vibration  COORDINATION:  finger-nose-finger, fine finger movements normal  REFLEXES:  deep tendon reflexes TRACE and symmetric  GAIT/STATION:  narrow based gait  DIAGNOSTIC DATA (LABS, IMAGING, TESTING) - I reviewed patient records, labs, notes, testing and imaging myself where available.  No results found for: "WBC", "HGB", "HCT", "MCV", "PLT" No results found for: "NA", "K", "CL", "CO2", "GLUCOSE", "BUN", "CREATININE", "CALCIUM", "PROT", "ALBUMIN", "AST", "ALT", "ALKPHOS", "BILITOT", "GFRNONAA", "GFRAA" No results found for: "CHOL", "HDL", "LDLCALC", "LDLDIRECT", "TRIG", "CHOLHDL" No results found for: "HGBA1C" No results found for: "VITAMINB12" No results found for: "TSH"     ASSESSMENT AND PLAN  57 y.o. year old male here with:   Dx:  1. Transient vision disturbance of both eyes     PLAN:  TRANSIENT BILATERAL VISION DISTURBANCE (bright white vision disturbance; 5 seconds; 2 events; ~Mar/Apr 2023 occurring few weeks after cataract surgeries; no recurrence; no associated symptoms) - unclear etiology; could have been related to some post-cataract surgery phenomenon giving timing of symptoms - considered MRI brain and carotid u/s, but likely low yield since no recurrence of these mild non-specific symptoms; will hold off for now and monitor symptoms  Return for pending if symptoms worsen or fail to improve, return to referring provider, return to PCP.    Penni Bombard, MD [id number], [id number] PM Certified in Neurology, Neurophysiology and Neuroimaging  [facility] [address] [place], [place] [address] [phone]

## 2022-03-13 NOTE — Patient Instructions (Signed)
TRANSIENT BILATERAL VISION DISTURBANCE (5 seconds; 2 events; ~Mar/Apr 2023 occurring few weeks after cataract surgeries; no recurrence; no associated symptoms) - unclear etiology; could have been related to some post-cataract surgery phenomenon giving timing of symptoms - considered MRI brain and carotid u/s, but likely low yield since no recurrence of these mild non-specific symptoms; will hold off for now and monitor symptoms

## 2022-12-10 ENCOUNTER — Ambulatory Visit (INDEPENDENT_AMBULATORY_CARE_PROVIDER_SITE_OTHER): Payer: 59 | Admitting: Orthopaedic Surgery

## 2022-12-10 ENCOUNTER — Other Ambulatory Visit (INDEPENDENT_AMBULATORY_CARE_PROVIDER_SITE_OTHER): Payer: 59

## 2022-12-10 VITALS — Wt 266.0 lb

## 2022-12-10 DIAGNOSIS — M1711 Unilateral primary osteoarthritis, right knee: Secondary | ICD-10-CM | POA: Diagnosis not present

## 2022-12-10 DIAGNOSIS — G8929 Other chronic pain: Secondary | ICD-10-CM

## 2022-12-10 DIAGNOSIS — M25561 Pain in right knee: Secondary | ICD-10-CM
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
## 2023-03-09 ENCOUNTER — Ambulatory Visit: Payer: 59 | Admitting: Orthopaedic Surgery

## 2023-10-26 ENCOUNTER — Other Ambulatory Visit: Payer: Self-pay | Admitting: Radiology

## 2023-10-26 ENCOUNTER — Other Ambulatory Visit (INDEPENDENT_AMBULATORY_CARE_PROVIDER_SITE_OTHER)

## 2023-10-26 ENCOUNTER — Ambulatory Visit: Admitting: Physician Assistant

## 2023-10-26 ENCOUNTER — Encounter: Payer: Self-pay | Admitting: Physician Assistant

## 2023-10-26 DIAGNOSIS — M25512 Pain in left shoulder: Secondary | ICD-10-CM

## 2023-10-26 DIAGNOSIS — M25511 Pain in right shoulder: Secondary | ICD-10-CM | POA: Diagnosis not present

## 2023-10-26 NOTE — Progress Notes (Signed)
 HPI: [name] 58 year old male well-known to Dr. Vernetta service comes in today with 3 weeks of bilateral shoulder pain.  No falls or injuries.  Pain began after flu shot.  He states his left shoulder is more painful than the right.  He notes decreased range of motion particularly the left arm.  He has been taking Tylenol he is unable to take NSAIDs due to the fact he has chronic stage III kidney disease.  He denies any numbness tingling down either arm.  Pain is worse whenever he is an active.  Review of systems: See HPI otherwise negative  Physical exam: General: Well-developed well-nourished male no acute distress.  Respirations: Unlabored Psych: Alert and orient x 3 Bilateral shoulders passive range of motion reveals crepitus both shoulders.  Has 5 out of 5 strength external and internal rotation against resistance.  Negative empty can test negative liftoff test bilaterally.  Active forward flexion 160 on the left and 170 on the right.   Radiographs: Left shoulder 3 views: Shoulder is well located.  Basically bone-on-bone glenohumeral joint arthritis.  Cystic changes within the humeral head.  No acute fractures shoulder is well located.  Moderate AC joint changes.  No acute findings.  Right shoulder 3 views: Shoulder is well located.  Near bone-on-bone glenohumeral joint arthritis.  AC joint changes consistent with moderate arthritis.  Cystic changes within the humeral head.  Large inferior osteophyte off the humeral head.  No acute fractures or acute findings  Impression: Left shoulder arthritis Right shoulder arthritis  Plan: Given the fact the patient is unable to take NSAIDs would recommend trial injection in his left shoulder and this would be a intra-articular injection under ultrasound.  See if this is helpful.  He may benefit from an injection in his right shoulder if the left shoulder has improvement.  Questions were encouraged and answered.

## 2023-11-02 ENCOUNTER — Other Ambulatory Visit: Payer: Self-pay

## 2023-11-02 ENCOUNTER — Encounter: Payer: Self-pay | Admitting: Sports Medicine

## 2023-11-02 ENCOUNTER — Ambulatory Visit: Admitting: Sports Medicine

## 2023-11-02 DIAGNOSIS — M25512 Pain in left shoulder: Secondary | ICD-10-CM | POA: Diagnosis not present

## 2023-11-02 DIAGNOSIS — E1169 Type 2 diabetes mellitus with other specified complication: Secondary | ICD-10-CM

## 2023-11-02 DIAGNOSIS — M19011 Primary osteoarthritis, right shoulder: Secondary | ICD-10-CM | POA: Diagnosis not present

## 2023-11-02 DIAGNOSIS — M19012 Primary osteoarthritis, left shoulder: Secondary | ICD-10-CM | POA: Diagnosis not present

## 2023-11-02 MED ORDER — METHYLPREDNISOLONE ACETATE 40 MG/ML IJ SUSP
60.0000 mg | INTRAMUSCULAR | Status: AC | PRN
  Administered 2023-11-02: 60 mg via INTRA_ARTICULAR

## 2023-11-02 MED ORDER — LIDOCAINE HCL 1 % IJ SOLN
2.0000 mL | INTRAMUSCULAR | Status: AC | PRN
  Administered 2023-11-02: 2 mL

## 2023-11-02 MED ORDER — BUPIVACAINE HCL 0.25 % IJ SOLN
2.0000 mL | INTRAMUSCULAR | Status: AC | PRN
  Administered 2023-11-02: 2 mL via INTRA_ARTICULAR

## 2023-11-02 NOTE — Progress Notes (Signed)
 Office & Procedure Note  Patient: Jaime Reynolds             Date of Birth: 03-17-1965           MRN: [id number]             Visit Date: 11/02/2023  HPI: Jaime Reynolds is a pleasant 58 year old who presents today for acute on chronic left > right shoulder pain.  Did review previous x-rays which show advanced osteoarthritic change.  He does work for the [employer] and [employer] in [place] and he does a lot of repetitive activity.  Pain is manageable although has restriction in range of motion going overhead and certain positions do cause him pain.  He is unable to take NSAIDs.  Does use over-the-counter arthritic medicine/tylenol.  He is a type-II diabetic as well. Managed on Trulicity IM weekly and Metformin 500mg  BID. Follows with Jaime Reynolds  His A1c's are as follows (reviewd from Jaime Reynolds - [facility] 11/01/23): - 09/11/2023: 7.5 - 05/15/2023: 7.2 - 03/03/2023: 7.4  PE:  - Left shoulder: No redness swelling or effusion.  There is restriction in endrange lection, abduction and external rotation with crepitus at endrange, likely given his underlying reticulocyte change.  Rotator cuff strength testing appears intact.  Imaging:  *Independent review of both three-view right and left shoulder x-ray from 10/26/2023 was performed by myself today.  X-rays show severe osteoarthritis with essentially bone-on-bone glenohumeral joint arthritic change with large osteophytes and osteophytosis bilaterally.  There is subchondral cystic change in the medial aspect of the humeral head bilaterally.  No acute fracture noted.  XR Shoulder Left Left shoulder 3 views: Shoulder is well located.  Basically bone-on-bone  glenohumeral joint arthritis.  Cystic changes within the humeral head.  No  acute fractures shoulder is well located.  Moderate AC joint changes.  No  acute findings. XR Shoulder Right Right shoulder 3 views: Shoulder is well located.  Near bone-on-bone  glenohumeral joint arthritis.  AC joint  changes consistent with moderate  arthritis.  Cystic changes within the humeral head.  Large inferior  osteophyte off the humeral head.  No acute fractures or acute findings  Visit Diagnoses:  1. Acute pain of left shoulder   2. Primary osteoarthritis of both shoulders   3. Type 2 diabetes mellitus with other specified complication, without long-term current use of insulin (HCC)    Procedures:  Large Joint Inj: L glenohumeral on 11/02/2023 3:55 PM Indications: pain Details: 22 G 3.5 in needle, ultrasound-guided posterior approach Medications: 2 mL lidocaine 1 %; 2 mL bupivacaine 0.25 %; 60 mg methylPREDNISolone acetate 40 MG/ML Outcome: tolerated well, no immediate complications  US -guided glenohumeral joint injection, Left shoulder After discussion on risks/benefits/indications, informed verbal consent was obtained. A timeout was then performed. The patient was positioned lying lateral recumbent on examination table. The patient's shoulder was prepped with betadine and multiple alcohol swabs and utilizing ultrasound guidance, the patient's glenohumeral joint was identified on ultrasound. Using ultrasound guidance a 22-gauge, 3.5 inch needle with a mixture of 2:2:1.5 cc's lidocaine:bupivicaine:depomedrol was directed from a lateral to medial direction via in-plane technique into the glenohumeral joint with visualization of appropriate spread of injectate into the joint. Patient tolerated the procedure well without immediate complications.      Procedure, treatment alternatives, risks and benefits explained, specific risks discussed. Consent was given by the patient. Immediately prior to procedure a time out was called to verify the correct patient, procedure, equipment, support staff and site/side marked  as required. Patient was prepped and draped in the usual sterile fashion.     Assessment/Plan: - Impression is acute exacerbation of left shoulder pain in the setting of bilateral  shoulder advanced glenohumeral joint osteoarthritic change. -He is unable to take NSAIDs, given this we did proceed with ultrasound-guided left shoulder joint corticosteroid injection, patient tolerated well. Advised on post-injection protocol. -Did advise on transient glucose rise given CSI, he will continue his Trulicity IM weekly as well as his metformin 500 mg twice daily.  - Did review both his left and right shoulder x-ray which is advanced in terms of arthritic change.  We will see how he does with the left shoulder, if he finds good relief from this, he may call or notify me and we could always consider injecting the right shoulder for symptomatic relief if needed. -Follow-up as needed  Lonell Sprang, DO Primary Care Sports Medicine Physician  [facility]  This note was dictated using Dragon naturally speaking software and may contain errors in syntax, spelling, or content which have not been identified prior to signing this note.

## 2023-11-09 ENCOUNTER — Encounter: Payer: Self-pay | Admitting: Radiology

## 2024-02-12 ENCOUNTER — Encounter: Payer: Self-pay | Admitting: Internal Medicine
